# Patient Record
Sex: Male | Born: 1960 | Race: White | Hispanic: Yes | Smoking: Current every day smoker
Health system: Southern US, Community
[De-identification: ages and names within clinical notes are randomized; demographics above are authoritative.]

---

## 2016-01-14 ENCOUNTER — Ambulatory Visit (INDEPENDENT_AMBULATORY_CARE_PROVIDER_SITE_OTHER): Payer: Self-pay

## 2016-01-14 ENCOUNTER — Ambulatory Visit (INDEPENDENT_AMBULATORY_CARE_PROVIDER_SITE_OTHER): Payer: Self-pay | Admitting: Family Medicine

## 2016-01-14 VITALS — BP 122/72 | HR 60 | Temp 98.3°F | Resp 17 | Ht 68.5 in | Wt 143.0 lb

## 2016-01-14 DIAGNOSIS — M549 Dorsalgia, unspecified: Secondary | ICD-10-CM

## 2016-01-14 MED ORDER — IBUPROFEN 600 MG PO TABS: 600.0000 mg | ORAL_TABLET | Freq: Three times a day (TID) | ORAL | 0 refills | Status: AC | PRN

## 2016-01-14 MED ORDER — TRAMADOL HCL 50 MG PO TABS: 50.0000 mg | ORAL_TABLET | Freq: Three times a day (TID) | ORAL | 0 refills | Status: AC | PRN

## 2016-01-14 NOTE — Patient Instructions (Addendum)
Ibuprofen 600 mg with food every 12 hours for 10-14 days for pain. Take Tramadol 50-100 mg every 8 hours as needed for pain. Return for follow-up in 7 days.  ibuprofeno 600 mg con alimentos cada 12 horas durante 10-14 das para el dolor. Tome Tramadol 50-100 mg cada 8 horas segn sea necesario para el dolor. Regrese en 7 das para la reevaluacin del dolor. I     IF you received an x-ray today, you will receive an invoice from [facility]. Please contact [facility] at [phone] with questions or concerns regarding your invoice.   IF you received labwork today, you will receive an invoice from [employer]. Please contact [facility] at [phone] with questions or concerns regarding your invoice.   Our billing staff will not be able to assist you with questions regarding bills from these companies.  You will be contacted with the lab results as soon as they are available. The fastest way to get your results is to activate your My Chart account. Instructions are located on the last page of this paperwork. If you have not heard from [place] regarding the results in 2 weeks, please contact this office.      Prevencin de las lesiones de la espalda (Back Injury Prevention) Las lesiones de la espalda pueden ser muy dolorosas. Adems son difciles de curar. Despus de haber tenido una lesin de la espalda, tiene ms probabilidad de lesionarse otra vez. Es importante que aprenda cmo evitar lesionarse o volver a Control and instrumentation engineer. Los siguientes consejos pueden ayudarlo a Product/process development scientist una lesin de la espalda. QU DEBO SABER SOBRE EL ESTADO FSICO?  Haga ejercicios durante 64mnutos diarios la mHartford Financialde la semana o como se lo haya indicado el mdico. AChief Strategy Officerde lo siguiente:  HSherilyn Cooterejercicios aerbicos, como caminar, trotar, andar en bicicleta o nadar.  Haga ejercicios que mejoren el equilibrio y la fuerza, cEssex Villagetai chi y el  yoga.  Haga ejercicios de elongacin. Estos ayudan con la flexibilidad.  Intente desarrollar msculos abdominales fuertes. Estos sirven de sostn para la espalda.  Mantenga un peso saludable. Esto ayuda a reducir eCatering managerde sufrir una lesin de la espalda. QU DEBO SABER SOBRE LA DEllenton  Hable con el mdico sobre la dieta en general. Tome vitaminas y suplementos solamente como se lo haya indicado el mdico.  Hable con el mdico sobre la cantidad de calcio y vitaminaD que necesita a diario. Estos nutrientes ayudan a eInsurance risk surveyorde los huesos (osteoporosis).  Incluya en su dieta buenas fuentes de calcio, por ejemplo:  Productos lcteos.  Verduras de hojas verdes.  Productos con agregado de calcio (fortificados).  Incluya en su dieta buenas fuentes de vitaminaD, por ejemplo:  Leche.  Alimentos con agregado de vitaminaD. QMount Healthy  Sintese y prese erguido. No se incline hacia adelante al sentarse ni se encorve al pararse.  Elija las sillas con un buen apoyo para la zona baja de la espalda (lumbar).  Si trabaja en un escritorio, sintese cerca de este para no tener que inclinarse. Mantenga el mentn hacia abajo. Mantenga el cuello hacia atrs. Mantenga doblados los codos para que la posicin de los brazos se vea como la letra "L" (ngulo recto).  Cuando conduzca, sintese elevado y cerca del volante. Agregue un apoyo para la zona baja de la espalda al asiento del automvil, si es necesario.  No permanezca sentado ni parado en una posicin durante mThe PNC Financial Tmese descansos  para pararse, estirarse y caminar, al menos una vez por hora. Tmese descansos cada hora si conduce durante perodos largos de tiempo.  Duerma de costado con las rodillas apenas dobladas o boca arriba con una almohada debajo de las rodillas. No duerma boca abajo. [place] CMO [place], SOBRE LOS MOVIMIENTOS DE TORSIN Y LOS DE  ESTIRAMIENTO Levantamiento de objetos y de cargas pesadas  No levante cargas pesadas y evite especialmente levantar objetos una y [place]. Si debe levantar cargas pesadas:  Elongue antes de hacerlo.  Trabaje lentamente.  Descanse despus de cada levantamiento.  Use una herramienta, como un carrito o una plataforma mvil para mover los objetos, si hay una disponible.  Haga varios viajes cortos, en lugar de llevar una carga pesada.  Pida ayuda cuando la necesite, en especial cuando mueva objetos de gran tamao.  Siga estos pasos cuando levante objetos:  Prese con los pies separados al ancho de los hombros.  Acrquese al objeto tanto como pueda. No levante un objeto pesado que est lejos de su cuerpo.  Use agarraderas o correas de elevacin si estn disponibles.  [place]. [place], pero mantenga los [employer].  Mantenga los hombros [employer]. Mantenga el mentn hacia abajo. Mantenga la espalda recta.  Levante lentamente el objeto mientras contrae los msculos de las piernas, el abdomen y los glteos. Mantenga el objeto tan cerca del centro del cuerpo como sea posible.  Siga estos pasos cuando baje una carga pesada:  Prese con los pies separados al ancho de los hombros.  Baje lentamente el objeto mientras contrae los msculos de las piernas, el abdomen y los glteos. Mantenga el objeto tan cerca del centro del cuerpo como sea posible.  Mantenga los hombros [employer]. Mantenga el mentn hacia abajo. Mantenga la espalda recta.  [place]. [place], pero mantenga los [employer].  Use agarraderas o correas de elevacin si estn disponibles. Movimientos de torsin y de estiramiento  No levante objetos pesados por encima del nivel de la cintura.  No tuerza la cintura mientras levanta o transporta una carga. Si tiene que girar, [employer].  No se incline sin flexionar las rodillas.  No se estire para alcanzar un objeto  que est por encima de su cabeza, al otro lado de una mesa o sobre una superficie elevada.  [place]?  Evite los pisos mojados y los suelos helados. Retire el hielo de las aceras para evitar las cadas.   No duerma sobre un colchn muy blando ni muy duro.   Mantenga los objetos que [place] a menudo en lugares de fcil acceso.   Coloque los objetos ms pesados en estantes a nivel de la cintura y los ms livianos en estantes ms bajos o ms altos.  Busque formas de reducir el estrs, por ejemplo:  Actividad fsica.  [place]aj[place].  Tcnicas de relajacin.  Hable con el mdico si est ansioso o deprimido. Estas afecciones pueden intensificar el dolor de espalda.  Use calzado sin tacones con suelas acolchonadas.  No haga movimientos rpidos (repentinos).  Use ambas correas de sujecin cuando cargue una mochila.  No consuma ningn producto que contenga tabaco, lo que incluye cigarrillos, tabaco de Higher education careers adviser o Psychologist, sport and exercise. Si necesita ayuda para dejar de fumar, consulte al mdico.   Esta informacin no tiene Marine scientist el consejo del mdico. Asegrese de hacerle al mdico cualquier pregunta que tenga.   Document Released: 04/01/2010 Document Revised:  07/14/2014 Elsevier Interactive Patient Education [employer].

## 2016-01-14 NOTE — Progress Notes (Signed)
Patient ID: Dennis Sutton, male    DOB: 07/06/1960, 55 y.o.   MRN: [id number]  PCP: No PCP Per Patient  Chief Complaint  Patient presents with  . Back Pain    Subjective:  Family is interpreting for patient. HPI 55 year old male and presents for evaluation of back pain times 1 week. [facility] He reports this is a non-workers' comp related injury. He is a Corporate investment bankerconstruction worker and reports that last week as he was leaving work, he slipped on the pavement, fell backwards onto his left side and landed on work that lying on the ground.  He reports pain is mid-back mostly on th left side and is worst with deep breathing. Denies pain radiating to anywhere on the back. He did not hit his head upon falling. He has not taken any medication for pain.   Social History   Social History  . Marital status: Widowed    Spouse name: N/A  . Number of children: N/A  . Years of education: N/A   Occupational History  . Not on file.   Social History Main Topics  . Smoking status: Current Every Day Smoker    Packs/day: 0.40    Years: 20.00  . Smokeless tobacco: Never Used  . Alcohol use No  . Drug use: No  . Sexual activity: No   Other Topics Concern  . Not on file   Social History Narrative  . No narrative on file    No family history on file.  Review of Systems See HPI There are no active problems to display for this patient.  Prior to Admission medications   Not on File   Not on File     Objective:  Physical Exam  Constitutional: He is oriented to person, place, and time. He appears well-developed and well-nourished.  HENT:  Head: Normocephalic and atraumatic.  Eyes: Conjunctivae are normal. Pupils are equal, round, and reactive to light.  Neck: Normal range of motion. Neck supple.  Cardiovascular: Normal rate, regular rhythm, normal heart sounds and intact distal pulses.   Pulmonary/Chest: Effort normal.  Tenderness mid back within deep breathing     Musculoskeletal: Normal range of motion.       Thoracic back: He exhibits tenderness, bony tenderness and swelling.       Back:       Arms: Neurological: He is alert and oriented to person, place, and time.  Skin: Skin is warm and dry.  Psychiatric: He has a normal mood and affect. His behavior is normal. Judgment and thought content normal.   Dg Thoracic Spine 2 View  Result Date: 01/14/2016 CLINICAL DATA:  Slip and fall onto left side 1 week ago. Right-sided mid back pain. EXAM: THORACIC SPINE 2 VIEWS COMPARISON:  None. FINDINGS: There is no evidence of thoracic spine fracture. Alignment is normal. No other significant bone abnormalities are identified. IMPRESSION: Negative. Electronically Signed   By: Gaylyn RongWalter  Liebkemann M.D.   On: 01/14/2016 15:12   Assessment & Plan:  1. Mid back pain on left side - DG Thoracic Spine 2 View- unremarkable and negative for acute fracture.  Plan: . traMADol (ULTRAM) 50 MG tablet    Sig: Take 1-2 tablets (50-100 mg total) by mouth every 8 (eight) hours as needed.  Marland Kitchen. ibuprofen (ADVIL,MOTRIN) 600 MG tablet    Sig: Take 1 tablet (600 mg total) by mouth every 8 (eight) hours as needed.   Return for follow-up in 7 days.  Cala BradfordKimberly  Johnathan HausenS. Itzamar Traynor, MSN, FNP-C [facility]

## 2017-03-19 IMAGING — DX DG THORACIC SPINE 2V
2 series · 2 of 2 positions shown · non-contrast
Comparison: None.

CLINICAL DATA: Slip and fall onto left side 1 week ago. Right-sided
mid back pain.

EXAM:
THORACIC SPINE 2 VIEWS

[t-spine ap]
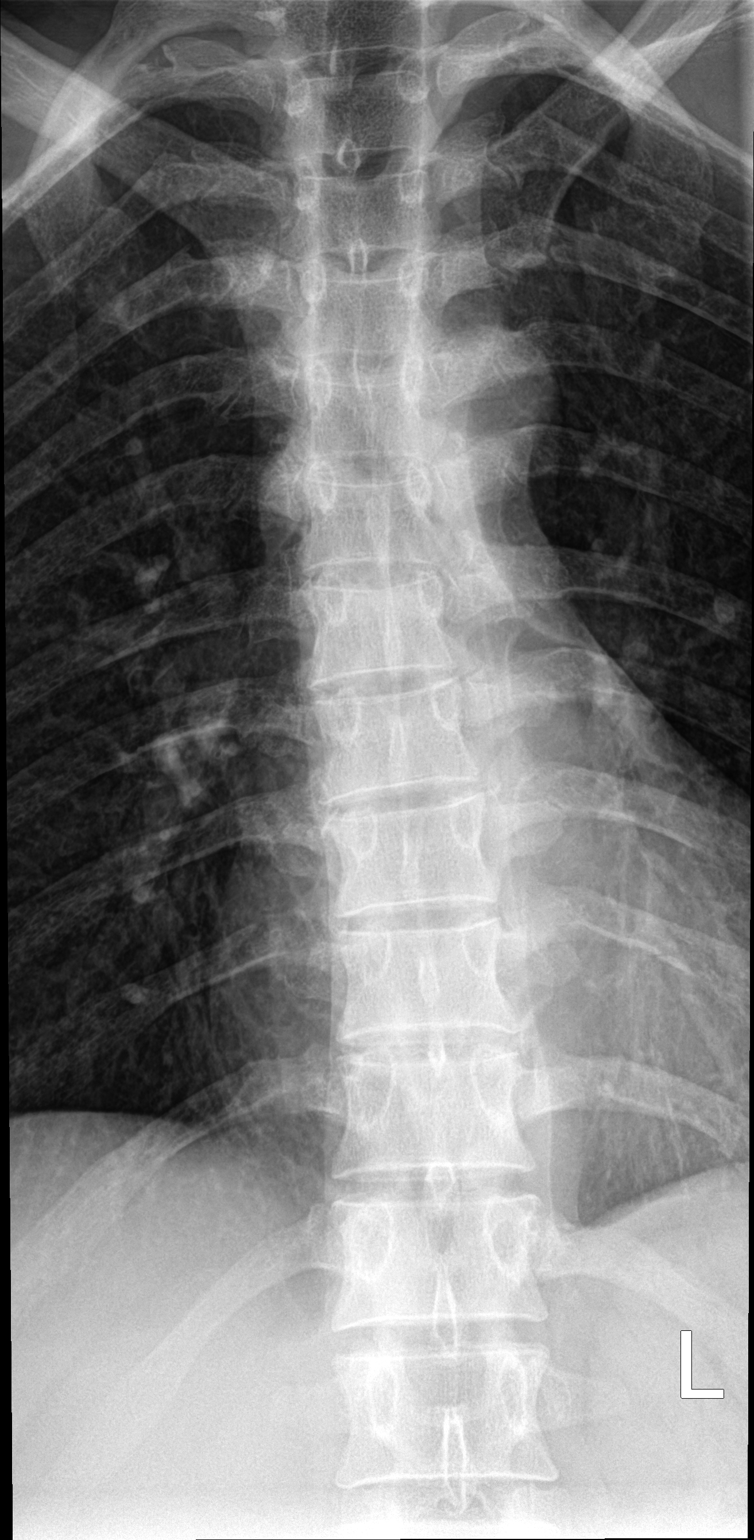

[t-spine lat]
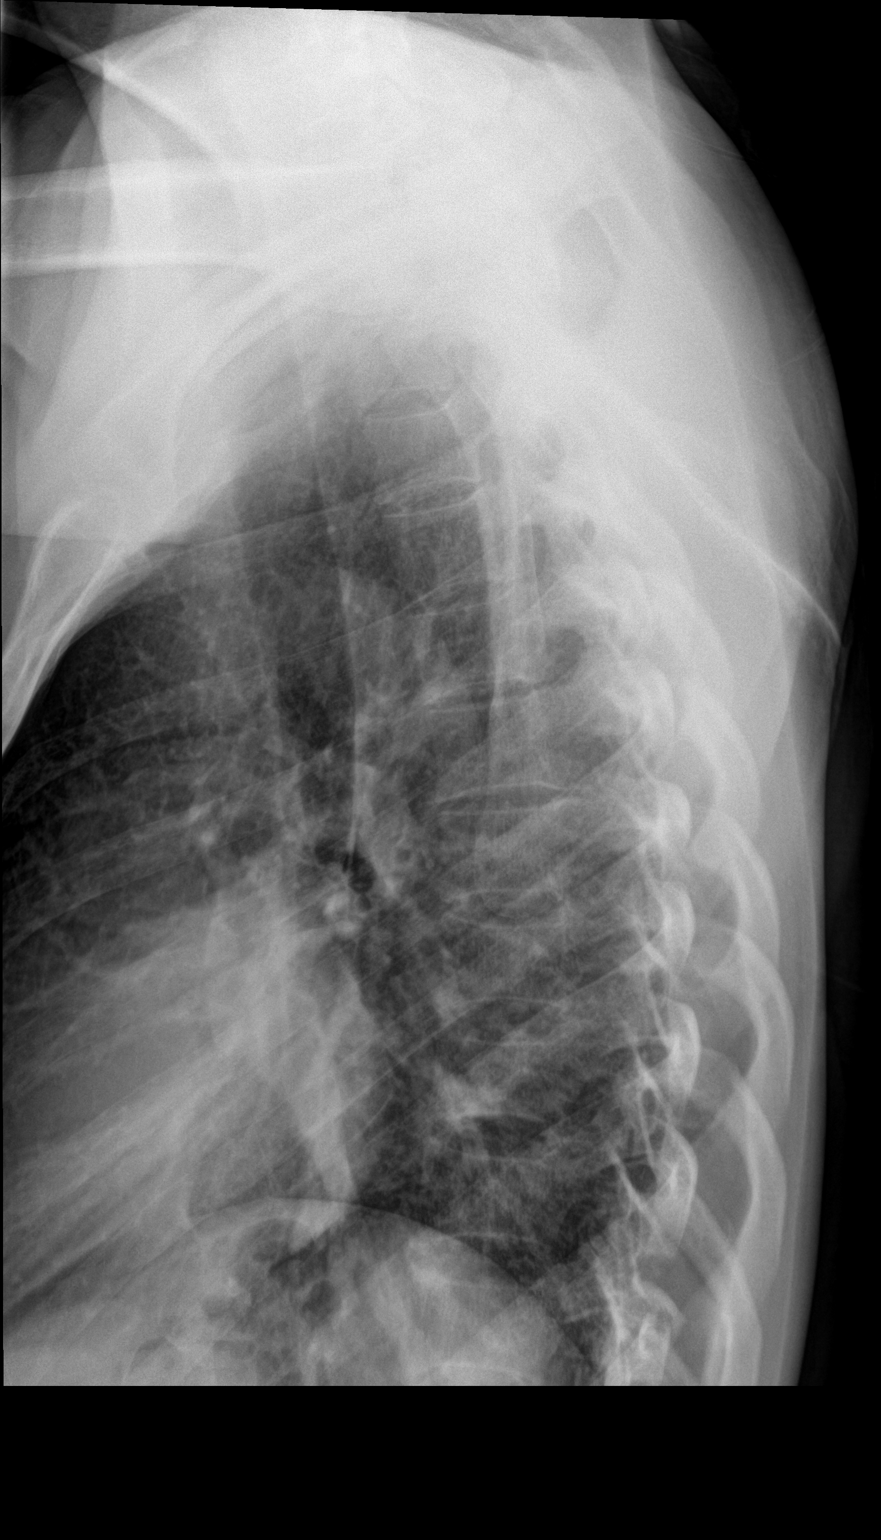

[2 of 2 positions shown; findings below may reference images not displayed]

FINDINGS: There is no evidence of thoracic spine fracture. Alignment is
normal. No other significant bone abnormalities are identified.
IMPRESSION: Negative.
